# Patient Record
Sex: Female | Born: 1975 | Hispanic: Yes | Marital: Single | Smoking: Never smoker
Health system: Southern US, Community
[De-identification: ages and names within clinical notes are randomized; demographics above are authoritative.]

## PROBLEM LIST (undated history)

## (undated) DIAGNOSIS — M549 Dorsalgia, unspecified: Secondary | ICD-10-CM

## (undated) HISTORY — DX: Dorsalgia, unspecified: M54.9

## (undated) HISTORY — PX: APPENDECTOMY: SHX54

---

## 2003-11-10 ENCOUNTER — Ambulatory Visit: Payer: Self-pay | Admitting: Surgery

## 2011-05-29 ENCOUNTER — Encounter: Payer: Self-pay | Admitting: Maternal & Fetal Medicine

## 2011-05-29 LAB — CBC WITH DIFFERENTIAL/PLATELET
Basophil #: 0 10*3/uL (ref 0.0–0.1)
Basophil %: 0.4 %
Eosinophil %: 1.6 %
HCT: 34.8 % — ABNORMAL LOW (ref 35.0–47.0)
HGB: 11.1 g/dL — ABNORMAL LOW (ref 12.0–16.0)
Lymphocyte #: 2.4 10*3/uL (ref 1.0–3.6)
MCH: 24.6 pg — ABNORMAL LOW (ref 26.0–34.0)
MCV: 77 fL — ABNORMAL LOW (ref 80–100)
Monocyte %: 5.7 %
Platelet: 236 10*3/uL (ref 150–440)
RDW: 20.1 % — ABNORMAL HIGH (ref 11.5–14.5)
WBC: 7.4 10*3/uL (ref 3.6–11.0)

## 2011-05-29 LAB — COMPREHENSIVE METABOLIC PANEL
Albumin: 3.7 g/dL (ref 3.4–5.0)
Alkaline Phosphatase: 71 U/L (ref 50–136)
BUN: 7 mg/dL (ref 7–18)
Calcium, Total: 8.6 mg/dL (ref 8.5–10.1)
Chloride: 106 mmol/L (ref 98–107)
Co2: 24 mmol/L (ref 21–32)
EGFR (African American): >60
EGFR (Non-African Amer.): >60
Glucose: 89 mg/dL (ref 65–99)
Osmolality: 271 (ref 275–301)
SGPT (ALT): 21 U/L
Sodium: 137 mmol/L (ref 136–145)
Total Protein: 7.8 g/dL (ref 6.4–8.2)

## 2011-06-08 ENCOUNTER — Emergency Department: Payer: Self-pay | Admitting: Emergency Medicine

## 2011-06-08 LAB — CBC
HCT: 31.1 % — ABNORMAL LOW (ref 35.0–47.0)
HGB: 11.3 g/dL — ABNORMAL LOW (ref 12.0–16.0)
HGB: 10 g/dL — ABNORMAL LOW (ref 12.0–16.0)
MCH: 25.2 pg — ABNORMAL LOW (ref 26.0–34.0)
MCH: 24.8 pg — ABNORMAL LOW (ref 26.0–34.0)
MCHC: 32.4 g/dL (ref 32.0–36.0)
MCHC: 32.2 g/dL (ref 32.0–36.0)
MCV: 78 fL — ABNORMAL LOW (ref 80–100)
MCV: 77 fL — ABNORMAL LOW (ref 80–100)
Platelet: 239 10*3/uL (ref 150–440)
Platelet: 213 10*3/uL (ref 150–440)
RBC: 4.49 10*6/uL (ref 3.80–5.20)
RBC: 4.03 10*6/uL (ref 3.80–5.20)

## 2014-05-17 NOTE — Consult Note (Signed)
PATIENT NAME:  Sydney Shelton, Sydney Shelton MR#:  [id number] DATE OF BIRTH:  Mar 08, 1975  DATE OF CONSULTATION:  06/08/2011  REFERRING PHYSICIAN:  Dr. Carollee MassedKaminski  CONSULTING PHYSICIAN:  Ricky L. Logan BoresEvans, MD  REASON FOR CONSULT: Bleeding early pregnancy.   HISTORY OF PRESENT ILLNESS: She is a 39 year old 353, P2-0-0-2 Hispanic female who is approximately nine weeks gestation by an of LMP 03/08. Began having spotting five days ago, which progressed to heavier with some cramping last evening, steadily increased throughout the day and became severe this afternoon.   On evaluation, she was found to be hemodynamically stable. Had a beta-hCG in excess of 17,000. Ultrasound was ordered which showed no intrauterine gestation, what appeared to be a symmetrical stripe approximately 9 mm and unremarkable adnexa bilaterally. No evidence of pelvic fluid. Patient has continued to bleed briskly and therefore I was consulted.   PAST MEDICAL HISTORY: She had a cesarean section with one of children, vaginal delivery with the other. She is healthy. Does not take medicine regularly. Planned [facility] but as of yet has not had her new OB check.   REVIEW OF SYSTEMS: She feels well. By the time I saw her stated her bleeding was a lot less with the last trip to the bathroom and she was cramping less.    PHYSICAL EXAMINATION:  VITAL SIGNS: She is afebrile. Vitals stable.   GENERAL: Alert and oriented and appropriate. Good historian. With the aid of Margarete as interpreter, reviewed with her the situation to which most likely had a completed miscarriage. I wanted to do an exam to make sure uterus was firming, her cervix appeared to be closing and she consented.    PELVIC: Pelvic tray was obtained. I went in with the Emergency Room nurse and speculum exam was carried out. There was what appeared to be decidual tissue at the os which teased free easily. The cervix would not admit the head of the ring  forceps and there was no active bleeding noted. Speculum was removed. Bimanual exam showed what appeared to be a firming uterus (patient is obese which obscured exam). Again the cervix appeared to be open.    IMPRESSION: Completed abortion.   PLAN: I am going to give her 0.2 of Methergine IV. She is to discharge home with no sex for two weeks, to see me again in two weeks and expect a menstrual-type flow for 5 to 10 days but if she becomes concerned she is to return to the ER. Anticipate no sequelae from this.   ____________________________ Reatha Harpsicky L. Logan BoresEvans, MD rle:cms D: 06/08/2011 21:49:13 ET T: 06/09/2011 09:34:15 ET JOB#: [id number]  cc: Ricky L. Logan BoresEvans, MD, <Dictator> Augustina MoodICK L Twan Harkin MD ELECTRONICALLY SIGNED 06/12/2011 12:13

## 2014-05-17 NOTE — Consult Note (Signed)
Referral Information:   Reason for Referral 39 yo G3P1101 with LMP 03/30/11 and EDD 01/04/12 8w 4 days by LMP referred for consultation due to hiv discordance (husband with virus x 12 years, followed at [facility] and history of nondetectable viral load),  vaccine exposure (mmr, influenza), and previous preterm birth/spontaneous preterm labor, children with congenital hearing deficits, and advanced maternal age.    Referring Physician [facility]    Prenatal Hx as above. couple reports consistent condom usage, however, report condom broke resulting in current pregnancy    Past Obstetrical Hx 2000, term, female vaginal delivery, female infant, 6lb 2003, preterm labor 'one week early', female breech, cesarean delivery, 5lb 12 oz   Home Medications: Medication Instructions Status  Prenatal Multivitamins oral tablet 1 tab(s) orally once a day Active   Allergies:   NKA: None  Vital Signs/Notes:  Nursing Vital Signs: **Vital Signs.:   06-May-13 09:06   Vital Signs Type Routine   Temperature Temperature (F) 98.1   Celsius 36.7   Temperature Source oral   Pulse Pulse 67   Pulse source per Dinamap   Respirations Respirations 12   Systolic BP Systolic BP 030   Diastolic BP (mmHg) Diastolic BP (mmHg) 60   Mean BP 74   BP Source Dinamap   Perinatal Consult:   Past Medical History cont'd 1. 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  Couple had many questions re transmission of virus to fetus and her risks.  Spanish  interpreter assistance used.  We addressed zero transmission in setting of maternal hiv negative status. We addressed the low risk of transmission in the setting of partners negative viral load and use of condomes. We also addressed post exposure prophylaxis options to decrease her risk in the event of another exposure (condom breakage, unprotected intercourse).  We also addressed CDC recommendations for preexposure prophylaxis with emtrictiabine/tenofovir (truvada) in the setting of hiv discordant couples and in the setting of couples attempting conception.  We adressed the fact that this regimen is not associated with teratogenicity, however, its use is not yet recommended in pregnancy. The CDC recommends practitioners report outcomes of patients remaining on this treatement following pregnancy, but does not yet recommend its use for hiv discordant couples during pregnancy. We also addressed general risks for transmission of the virus to a neonate in the setting of maternal hiv infection (NOT the scenario here). In the setting of HAART therapy (highly active antiretroviral rx) and nondetectable viral load, the risk is less than 1%. This risk can be further reduced by offering c/s at 38 weeks, prior to the onset of labor.  2. We addressed her slightly increased risk for recurrent preterm birth given her preterm labor at [place].  3.  She met with our genetic counselor due to her daugters with history of congenital hearing loss, as well as her advanced maternal age. She declines nuchal translucency(NT) mesurement or serum screening for aneuploidy.  Consent was obtained to review the children's records from [facility].    Recommendations 1. Recommend hiv testing each trimester.  She had negative testing in  the first trimester.  We would recommend follow up screening in the second trimester (can perform with her glucola screen) and again in the third trimester (at ~35-[redacted] weeks gestation).  Try to obtain PCR testing for  the third trimester since this will detect the viral load and determine if there is early infection. If this testing is positive, I would treat according to intrapartum guidelines (with AZT) and plan for repeat c/s at 38 weeks.  If this testing remains negative, routine care should continue. 2.  I provided her with a prescription for Combivir (AZT/lamivudine) to take in the event they experience another condom failure. We addressed the need to begin Rx immediately and no later than 72 h post exposure.  We also addressed potential side effects of meds (anemia, n/v, liver damage) and the need to notify health care provider should she need to take this med.  This combination is used frequently during pregnancy and is not associated with teratogenicity. 3. I obtained baseline maternal chemistry panel, cbc, liver function testing. If she needs to take postexposure prophylaxis, I would consider initiation of preexposure prophylaxis for the remainder of pregnancy (truvada).   4. Recommend baseline cervical length at the time of the anatomic survey due to history of later prreterm birth.  We addressed possible options (vaginal progesterone, decreased activity level) if cervical shortening is seen at that time. 5. Vaccines administered are not associated with teratogenicity and the patient was informed of this fact. 6. She declines further serum screening or NT for aneuploidy detection 7.  She met withgenetic counselor and gave consent for acquisition of daughters' [facility] records  I spoke with my colleague, Dr. Lehman Prom about these recommendations as this is a particular area of expertise and interest for her.    She will see the patient for follow up at the time of her next ultrasound.   Plan:   Genetic Counseling yes, performed today    Prenatal Diagnosis Options declines aneuploidy testing, see genetic counselor noted for details    Additional Testing Folate/prenatal vitamins     Total Time Spent with  Patient 45 minutes    >50% of visit spent in couseling/coordination of care yes    Office Use Only 99243  Level 3 (36mn) NEW office consult detailed   Coding Description: MATERNAL CONDITIONS/HISTORY INDICATION(S).   History of prior preg w/ preterm labor or complicated by PTL.   Previous C-section.   OTHER: family history of birth defects.  Electronic Signatures: SManfred Shirts(MD)  (Signed 06-May-13 12:57)  Authored: Referral, Home Medications, Allergies, Vital Signs/Notes, Consult, Impression, Plan, Billing, Coding Description   Last Updated: 06-May-13 12:57 by SManfred Shirts(MD)

## 2015-11-17 DIAGNOSIS — Z315 Encounter for genetic counseling: Secondary | ICD-10-CM | POA: Insufficient documentation

## 2015-11-17 DIAGNOSIS — O09521 Supervision of elderly multigravida, first trimester: Secondary | ICD-10-CM | POA: Insufficient documentation

## 2015-12-07 DIAGNOSIS — O352XX0 Maternal care for (suspected) hereditary disease in fetus, not applicable or unspecified: Secondary | ICD-10-CM | POA: Insufficient documentation

## 2016-11-22 ENCOUNTER — Other Ambulatory Visit: Payer: Self-pay | Admitting: Internal Medicine

## 2016-11-22 DIAGNOSIS — R7303 Prediabetes: Secondary | ICD-10-CM | POA: Insufficient documentation

## 2016-11-22 DIAGNOSIS — E7800 Pure hypercholesterolemia, unspecified: Secondary | ICD-10-CM | POA: Insufficient documentation

## 2016-11-22 DIAGNOSIS — Z1239 Encounter for other screening for malignant neoplasm of breast: Secondary | ICD-10-CM

## 2018-03-19 ENCOUNTER — Other Ambulatory Visit: Payer: Self-pay | Admitting: Internal Medicine

## 2018-03-19 DIAGNOSIS — Z1231 Encounter for screening mammogram for malignant neoplasm of breast: Secondary | ICD-10-CM

## 2018-03-26 ENCOUNTER — Ambulatory Visit
Admission: RE | Admit: 2018-03-26 | Discharge: 2018-03-26 | Disposition: A | Discharge status: Home / Self Care | Payer: Self-pay | Source: Ambulatory Visit | Attending: Internal Medicine | Admitting: Internal Medicine

## 2018-03-26 DIAGNOSIS — Z1231 Encounter for screening mammogram for malignant neoplasm of breast: Secondary | ICD-10-CM | POA: Insufficient documentation

## 2019-04-13 ENCOUNTER — Ambulatory Visit: Payer: Self-pay | Attending: Internal Medicine

## 2019-04-13 ENCOUNTER — Other Ambulatory Visit: Payer: Self-pay

## 2019-04-13 DIAGNOSIS — Z23 Encounter for immunization: Secondary | ICD-10-CM

## 2019-04-13 NOTE — Progress Notes (Signed)
   Covid-19 Vaccination Clinic  Name:  Sydney Shelton    MRN: [id number] DOB: Aug 21, 1975  04/13/2019  Ms. Sydney Shelton was observed post Covid-19 immunization for 15 minutes without incident. She was provided with Vaccine Information Sheet and instruction to access the V-Safe system.   Ms. Sydney Shelton was instructed to call 911 with any severe reactions post vaccine: Marland Kitchen Difficulty breathing  . Swelling of face and throat  . A fast heartbeat  . A bad rash all over body  . Dizziness and weakness   Immunizations Administered    Name Date Dose VIS Date Route   Pfizer COVID-19 Vaccine 04/13/2019  5:29 PM 0.3 mL 01/03/2019 Intramuscular   Manufacturer: [employer], [employer]   Lot: [id number]   NDC: [address]-2

## 2019-04-24 ENCOUNTER — Ambulatory Visit: Payer: Self-pay

## 2019-05-04 ENCOUNTER — Ambulatory Visit: Payer: Self-pay | Attending: Internal Medicine

## 2019-05-04 DIAGNOSIS — Z23 Encounter for immunization: Secondary | ICD-10-CM

## 2019-05-04 NOTE — Progress Notes (Signed)
   Covid-19 Vaccination Clinic  Name:  Sydney Shelton    MRN: [id number] DOB: 1975/10/13  05/04/2019  Ms. Sydney Shelton was observed post Covid-19 immunization for 15 minutes without incident. She was provided with Vaccine Information Sheet and instruction to access the V-Safe system.   Ms. Sydney Shelton was instructed to call 911 with any severe reactions post vaccine: Marland Kitchen Difficulty breathing  . Swelling of face and throat  . A fast heartbeat  . A bad rash all over body  . Dizziness and weakness   Immunizations Administered    Name Date Dose VIS Date Route   Pfizer COVID-19 Vaccine 05/04/2019  4:21 PM 0.3 mL 01/03/2019 Intramuscular   Manufacturer: [employer], [employer]   Lot: [phone] [id number]   NDC: [address]-6

## 2019-10-16 ENCOUNTER — Other Ambulatory Visit: Payer: Self-pay | Admitting: Physician Assistant

## 2019-10-16 DIAGNOSIS — N644 Mastodynia: Secondary | ICD-10-CM

## 2020-01-14 ENCOUNTER — Other Ambulatory Visit: Payer: Self-pay | Admitting: Physician Assistant

## 2020-01-14 DIAGNOSIS — R1011 Right upper quadrant pain: Secondary | ICD-10-CM

## 2020-01-19 ENCOUNTER — Ambulatory Visit
Admission: RE | Admit: 2020-01-19 | Discharge: 2020-01-19 | Disposition: A | Discharge status: Home / Self Care | Payer: BLUE CROSS/BLUE SHIELD | Source: Ambulatory Visit | Attending: Physician Assistant | Admitting: Physician Assistant

## 2020-01-19 ENCOUNTER — Other Ambulatory Visit: Payer: Self-pay

## 2020-01-19 DIAGNOSIS — R1011 Right upper quadrant pain: Secondary | ICD-10-CM | POA: Insufficient documentation

## 2020-01-29 ENCOUNTER — Ambulatory Visit (LOCAL_COMMUNITY_HEALTH_CENTER): Payer: Self-pay

## 2020-01-29 ENCOUNTER — Other Ambulatory Visit: Payer: Self-pay

## 2020-01-29 VITALS — BP 123/76 | Ht 62.0 in | Wt 196.5 lb

## 2020-01-29 DIAGNOSIS — Z3202 Encounter for pregnancy test, result negative: Secondary | ICD-10-CM

## 2020-01-29 LAB — PREGNANCY, URINE: Preg Test, Ur: NEGATIVE

## 2020-01-29 NOTE — Progress Notes (Signed)
UPT negative today. LMP 12/28/2019. Per pt, last sex one week ago with condom.  Reports feeling movement in abdomen, "feels hard. " Reports being seen by [facility] and had ultrasound. States no pregnancy has been diagnosed.  RN counseled pt to follow up with PCP and to seek immediate medical attn with increased abd pain and/or bleeding. Counseled to return for preg test if no menses. Per pt request,  local provider resource list given and explained. Questions answered and reports understanding. Juliene Pina, interpeter today. Jerel Shepherd, RN

## 2020-03-08 ENCOUNTER — Other Ambulatory Visit: Payer: Self-pay | Admitting: Internal Medicine

## 2020-03-08 DIAGNOSIS — R140 Abdominal distension (gaseous): Secondary | ICD-10-CM

## 2020-04-29 ENCOUNTER — Other Ambulatory Visit: Payer: Self-pay | Admitting: Internal Medicine

## 2020-04-29 DIAGNOSIS — R1084 Generalized abdominal pain: Secondary | ICD-10-CM

## 2020-05-27 ENCOUNTER — Other Ambulatory Visit: Payer: Self-pay

## 2020-05-27 ENCOUNTER — Ambulatory Visit
Admission: RE | Admit: 2020-05-27 | Discharge: 2020-05-27 | Disposition: A | Discharge status: Home / Self Care | Payer: BC Managed Care – PPO | Source: Ambulatory Visit | Attending: Internal Medicine | Admitting: Internal Medicine

## 2020-05-27 DIAGNOSIS — R1084 Generalized abdominal pain: Secondary | ICD-10-CM | POA: Insufficient documentation

## 2020-05-27 MED ORDER — IOHEXOL 300 MG/ML  SOLN
100.0000 mL | Freq: Once | INTRAMUSCULAR | Status: AC | PRN
  Administered 2020-05-27: 100 mL via INTRAVENOUS

## 2020-09-10 ENCOUNTER — Ambulatory Visit: Payer: Self-pay | Admitting: Podiatry

## 2020-11-10 ENCOUNTER — Encounter: Payer: Self-pay | Admitting: Podiatry

## 2020-11-10 ENCOUNTER — Encounter (INDEPENDENT_AMBULATORY_CARE_PROVIDER_SITE_OTHER): Payer: Self-pay

## 2020-11-10 ENCOUNTER — Other Ambulatory Visit: Payer: Self-pay

## 2020-11-10 ENCOUNTER — Ambulatory Visit (INDEPENDENT_AMBULATORY_CARE_PROVIDER_SITE_OTHER): Payer: 59 | Admitting: Podiatry

## 2020-11-10 DIAGNOSIS — N920 Excessive and frequent menstruation with regular cycle: Secondary | ICD-10-CM | POA: Insufficient documentation

## 2020-11-10 DIAGNOSIS — B351 Tinea unguium: Secondary | ICD-10-CM

## 2020-11-10 MED ORDER — TERBINAFINE HCL 250 MG PO TABS: 250.0000 mg | ORAL_TABLET | Freq: Every day | ORAL | 0 refills | Status: AC

## 2020-11-10 NOTE — Progress Notes (Signed)
  Subjective:  Patient ID: Sydney Shelton, female    DOB: 1975/08/19,  MRN: [id number]  Chief Complaint  Patient presents with   Nail Problem    New pt-Nail fungus on both feet-pts [facility] would not process. Pt aware will be SELF PAY if insurance doesnt go through.    45 y.o. female presents with the above complaint. History confirmed with patient.  Translator is available by phone to provide translation from [place] to [place].  The nails have gotten thickened and brown discolored on the right foot  Objective:  Physical Exam: warm, good capillary refill, no trophic changes or ulcerative lesions, normal DP and PT pulses, and normal sensory exam. Left Foot: normal exam, no swelling, tenderness, instability; ligaments intact, full range of motion of all ankle/foot joints Right Foot: dystrophic yellowed discolored nail plates with subungual debris   Assessment:   1. Onychomycosis      Plan:  Patient was evaluated and treated and all questions answered.  Discussed etiology and treatment options of onychomycosis in detail.  We discussed oral and topical therapy.  I recommend oral therapy with Lamisil.  She has no history of liver disease and her last LFTs were normal.  Discussed the risk benefits and potential side effects of the medication.  I will see her back in 4 months after treatment.  Return in about 4 months (around 03/13/2021) for follow up after nail fungus treatment.

## 2021-03-14 ENCOUNTER — Ambulatory Visit: Payer: Self-pay | Admitting: Podiatry

## 2021-04-11 ENCOUNTER — Ambulatory Visit: Payer: Self-pay | Admitting: Podiatry

## 2021-05-18 ENCOUNTER — Ambulatory Visit: Payer: Self-pay | Admitting: Podiatry

## 2021-05-19 ENCOUNTER — Other Ambulatory Visit: Payer: Self-pay | Admitting: Internal Medicine

## 2021-05-19 DIAGNOSIS — Z1231 Encounter for screening mammogram for malignant neoplasm of breast: Secondary | ICD-10-CM

## 2021-05-25 ENCOUNTER — Other Ambulatory Visit: Payer: Self-pay | Admitting: Internal Medicine

## 2021-05-25 DIAGNOSIS — N644 Mastodynia: Secondary | ICD-10-CM

## 2021-05-25 DIAGNOSIS — Z1231 Encounter for screening mammogram for malignant neoplasm of breast: Secondary | ICD-10-CM

## 2021-07-18 ENCOUNTER — Ambulatory Visit: Payer: Self-pay | Admitting: Podiatry

## 2021-08-01 ENCOUNTER — Ambulatory Visit (INDEPENDENT_AMBULATORY_CARE_PROVIDER_SITE_OTHER): Payer: BC Managed Care – PPO | Admitting: Podiatry

## 2021-08-01 ENCOUNTER — Encounter: Payer: Self-pay | Admitting: Podiatry

## 2021-08-01 DIAGNOSIS — B351 Tinea unguium: Secondary | ICD-10-CM

## 2021-08-01 MED ORDER — TERBINAFINE HCL 250 MG PO TABS: 250.0000 mg | ORAL_TABLET | Freq: Every day | ORAL | 0 refills | Status: AC

## 2021-08-01 NOTE — Progress Notes (Signed)
  Subjective:  Patient ID: Sydney Shelton, female    DOB: 28-Oct-1975,  MRN: [id number]  Follow-up on nail fungus  46 y.o. female presents with the above complaint. History confirmed with patient.  She has improvement about 50% better than it was, she took the 3 months of Lamisil had no side effects  Objective:  Physical Exam: warm, good capillary refill, no trophic changes or ulcerative lesions, normal DP and PT pulses, and normal sensory exam.  Right hallux with 50% clearance of onychomycosis    Assessment:   1. Onychomycosis       Plan:  Patient was evaluated and treated and all questions answered.  Overall doing well and has some improvement she has noted about 50% improvement since beginning therapy with Lamisil.  I recommend continued therapy with p.o. Lamisil.  Rx was sent to pharmacy for a refill.  I will see her back in 4 months for follow-up  Return in about 4 months (around 12/02/2021) for follow up after nail fungus treatment.

## 2021-11-17 ENCOUNTER — Other Ambulatory Visit: Payer: BC Managed Care – PPO

## 2021-11-30 ENCOUNTER — Other Ambulatory Visit: Payer: Self-pay | Admitting: Internal Medicine

## 2021-11-30 ENCOUNTER — Encounter: Payer: Self-pay | Admitting: Internal Medicine

## 2021-11-30 ENCOUNTER — Ambulatory Visit
Admission: RE | Admit: 2021-11-30 | Discharge: 2021-11-30 | Disposition: A | Discharge status: Home / Self Care | Payer: BC Managed Care – PPO | Source: Ambulatory Visit | Attending: Internal Medicine | Admitting: Internal Medicine

## 2021-11-30 DIAGNOSIS — Z1231 Encounter for screening mammogram for malignant neoplasm of breast: Secondary | ICD-10-CM | POA: Diagnosis present

## 2021-11-30 DIAGNOSIS — N644 Mastodynia: Secondary | ICD-10-CM | POA: Insufficient documentation

## 2021-12-05 ENCOUNTER — Ambulatory Visit: Payer: BC Managed Care – PPO | Admitting: Podiatry

## 2022-01-02 ENCOUNTER — Ambulatory Visit: Payer: BC Managed Care – PPO | Admitting: Podiatry

## 2022-01-30 ENCOUNTER — Ambulatory Visit: Payer: BC Managed Care – PPO | Admitting: Podiatry

## 2022-02-27 ENCOUNTER — Ambulatory Visit: Payer: BC Managed Care – PPO | Admitting: Podiatry

## 2022-02-27 ENCOUNTER — Encounter: Payer: Self-pay | Admitting: Podiatry

## 2022-02-27 VITALS — BP 130/81 | HR 74

## 2022-02-27 DIAGNOSIS — B351 Tinea unguium: Secondary | ICD-10-CM

## 2022-02-27 MED ORDER — TERBINAFINE HCL 250 MG PO TABS: 250.0000 mg | ORAL_TABLET | Freq: Every day | ORAL | 0 refills | Status: AC

## 2022-02-27 NOTE — Progress Notes (Signed)
  Subjective:  Patient ID: Sydney Shelton, female    DOB: June 25, 1975,  MRN: [id number]  Follow-up on nail fungus  47 y.o. female presents with the above complaint. History confirmed with patient.  She said the right great toenail is doing much better, the left has started to show discoloration over the last couple of months  Objective:  Physical Exam: warm, good capillary refill, no trophic changes or ulcerative lesions, normal DP and PT pulses, and normal sensory exam.  Right hallux with 80 % clearance of onychomycosis, left great toe onychomycosis with 20% involvement    Assessment:   1. Onychomycosis       Plan:  Patient was evaluated and treated and all questions answered.  She has done very well with previous Lamisil administration.  I recommend we continue this another 90-day course.  This was sent to her pharmacy.  She will return in 3 months for follow-up, I think her last course needed further close follow-up and readministration of the Lamisil and she missed her appointment with me in November so suspect this is likely residual onychomycosis as opposed to new infection  Return in about 3 months (around 05/28/2022) for follow up after nail fungus treatment.

## 2022-05-31 ENCOUNTER — Ambulatory Visit: Payer: BC Managed Care – PPO | Admitting: Podiatry

## 2022-08-12 IMAGING — US US ABDOMEN LIMITED
1 series · 14 of 25 positions shown · non-contrast
Comparison: None.

CLINICAL DATA: Right upper quadrant pain for 2 weeks.

EXAM:
ULTRASOUND ABDOMEN LIMITED RIGHT UPPER QUADRANT

[Series 1: us abdomen limited ruq (liver/gb) · 14 of 42 slices shown]
[im 1/42]
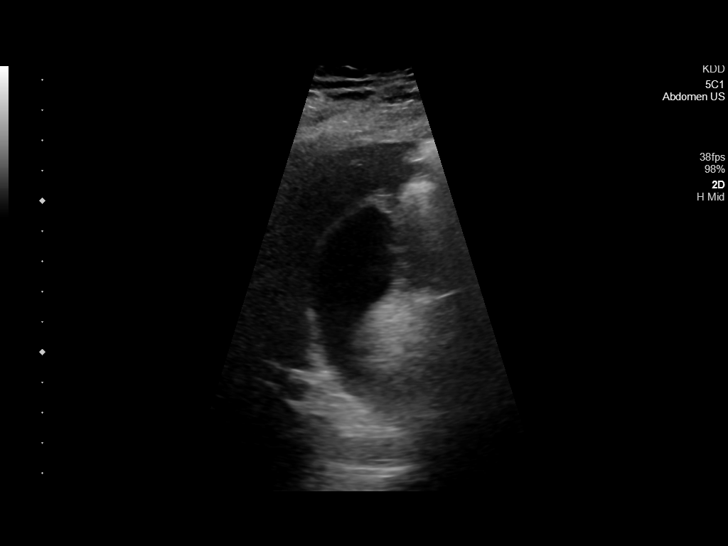
[im 4/42]
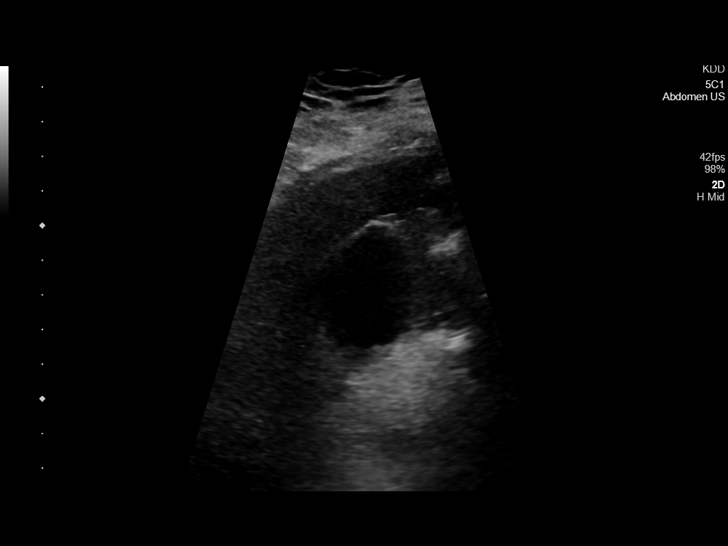
[im 7/42]
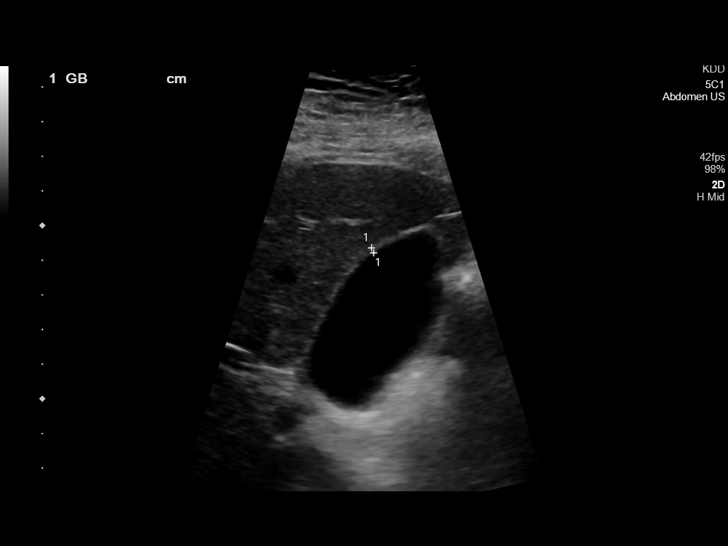
[im 11/42]
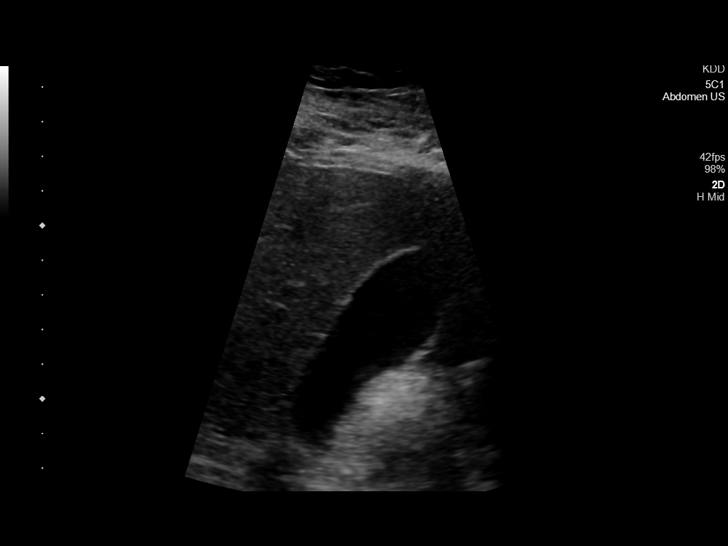
[im 14/42]
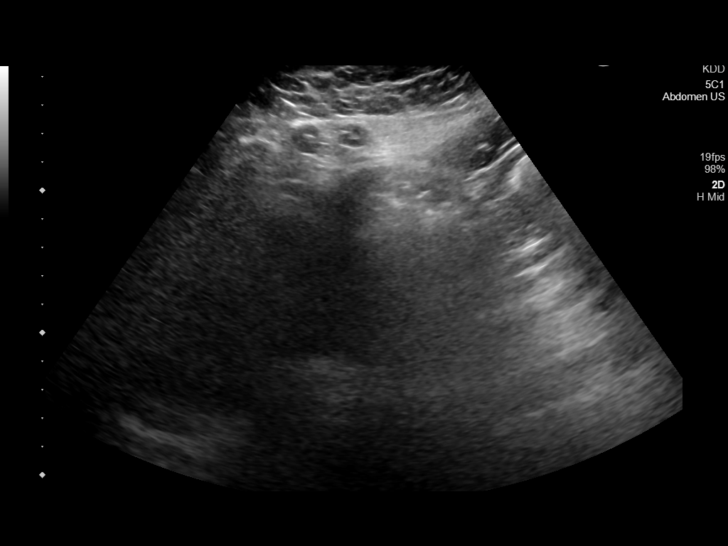
[im 16/42]
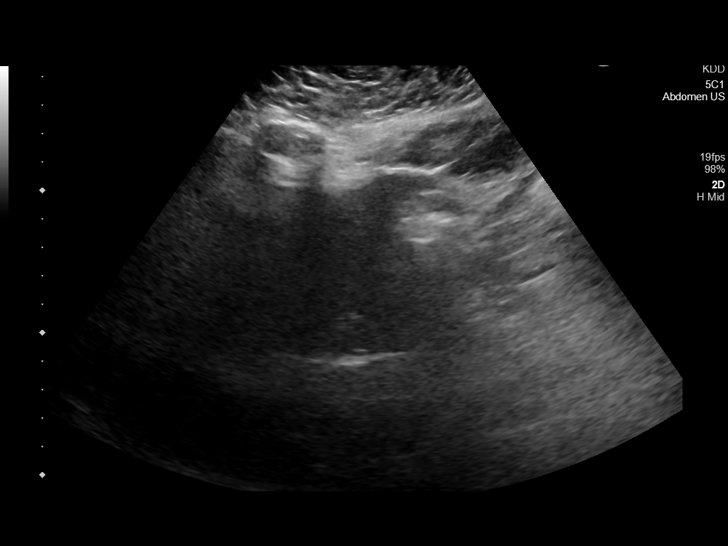
[im 19/42]
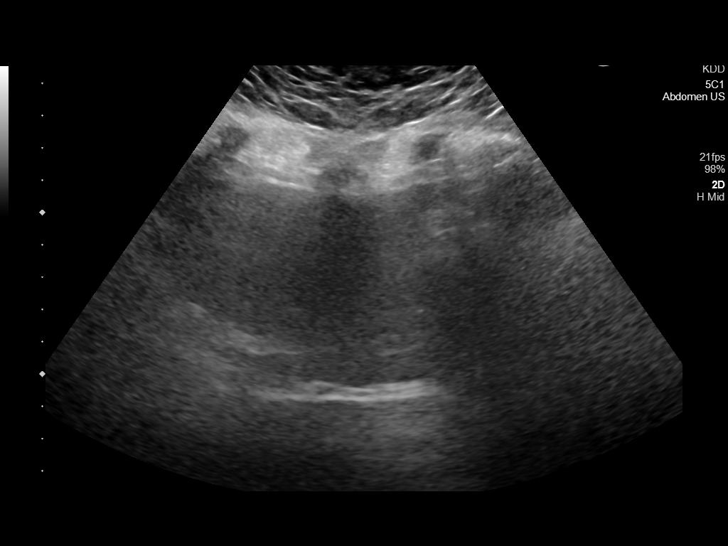
[im 23/42]
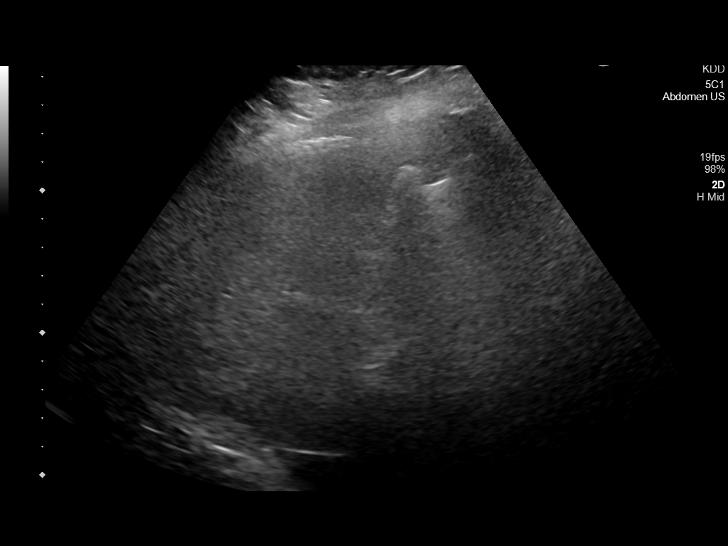
[im 26/42]
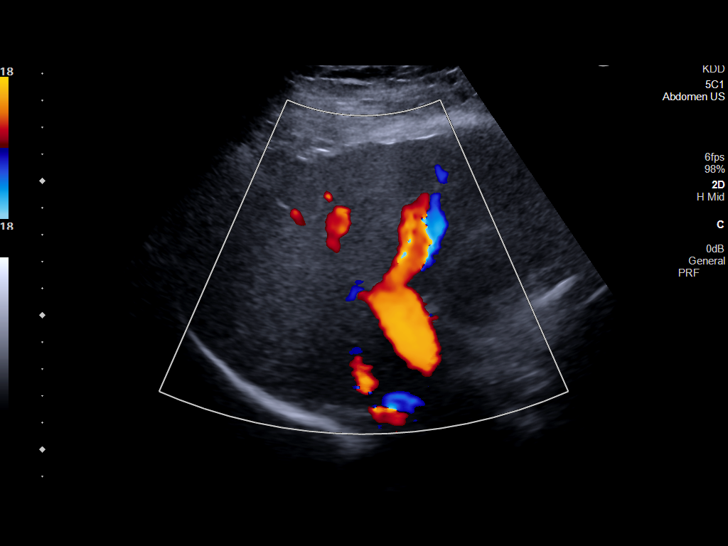
[im 28/42]
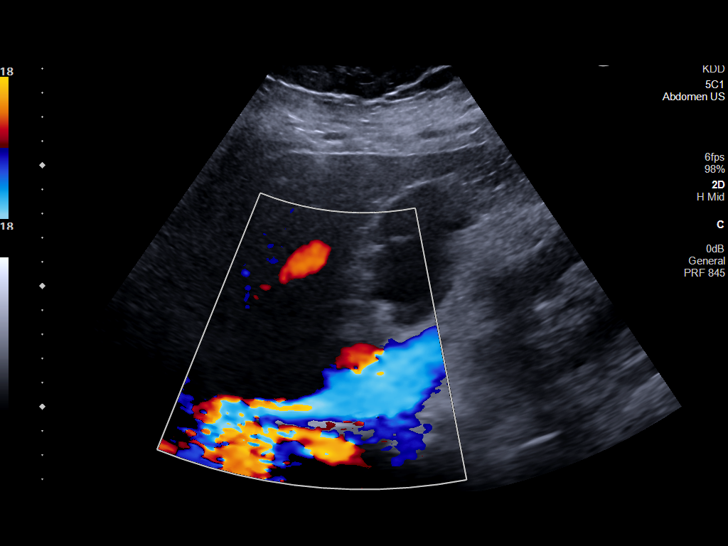
[im 31/42]
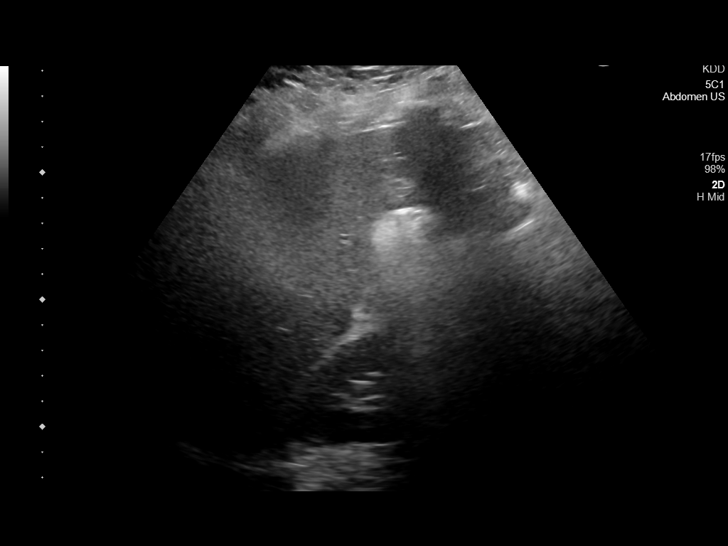
[im 35/42]
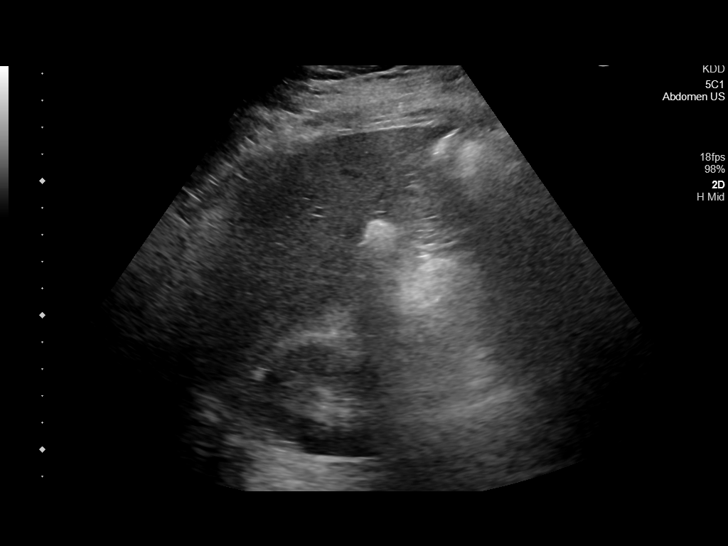
[im 38/42]
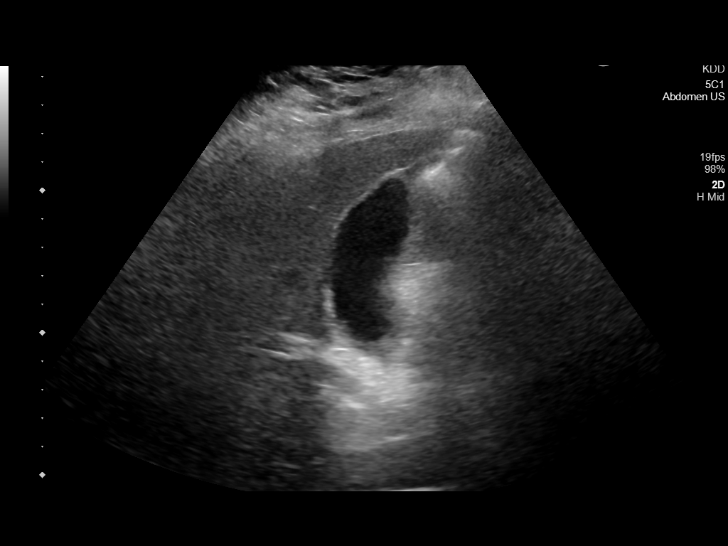
[im 42/42]
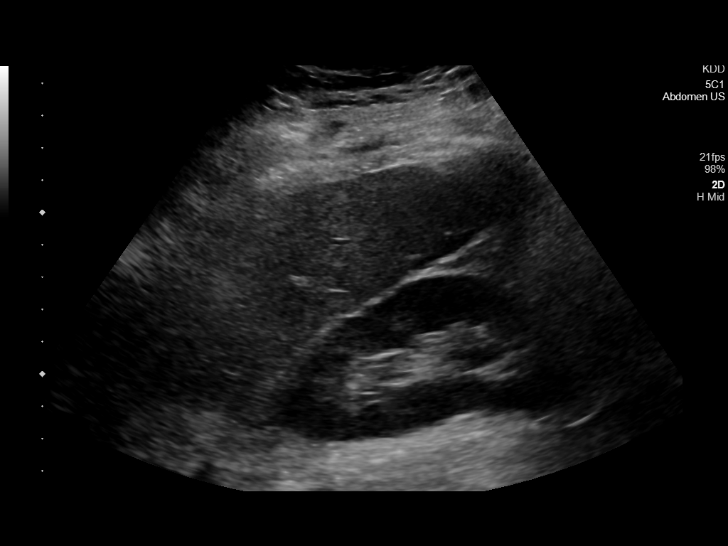

[14 of 25 positions shown; findings below may reference images not displayed]

FINDINGS: Gallbladder:

No gallstones or wall thickening visualized. No sonographic Murphy
sign noted by sonographer.

Common bile duct:

Diameter: 0.2 cm

Liver:

No focal lesion identified. Within normal limits in parenchymal
echogenicity. Portal vein is patent on color Doppler imaging with
normal direction of blood flow towards the liver.

Other: None.
IMPRESSION: Normal exam.  Negative for gallstones.

## 2022-08-28 ENCOUNTER — Ambulatory Visit: Payer: Self-pay | Admitting: Podiatry

## 2022-10-27 ENCOUNTER — Other Ambulatory Visit: Payer: Self-pay | Admitting: Obstetrics and Gynecology

## 2022-10-27 DIAGNOSIS — Z1231 Encounter for screening mammogram for malignant neoplasm of breast: Secondary | ICD-10-CM

## 2022-12-12 ENCOUNTER — Ambulatory Visit
Admission: RE | Admit: 2022-12-12 | Discharge: 2022-12-12 | Disposition: A | Discharge status: Home / Self Care | Payer: BC Managed Care – PPO | Source: Ambulatory Visit | Attending: Obstetrics and Gynecology | Admitting: Obstetrics and Gynecology

## 2022-12-12 DIAGNOSIS — Z1231 Encounter for screening mammogram for malignant neoplasm of breast: Secondary | ICD-10-CM | POA: Insufficient documentation

## 2022-12-19 IMAGING — CT CT ABD-PELV W/ CM
2 of 5 series · 15 of 46 positions shown, 17 images · IV contrast (APPLIED)
Comparison: Ultrasound January 19, 2020,June 08, 2011 and CT
November 10, 2003.

CLINICAL DATA: Abdominal distension concern for pelvic mass

EXAM:
CT ABDOMEN AND PELVIS WITH CONTRAST
TECHNIQUE: Multidetector CT imaging of the abdomen and pelvis was performed
using the standard protocol following bolus administration of
intravenous contrast.
CONTRAST:  100mL OMNIPAQUE IOHEXOL 300 MG/ML  SOLN

[Series 2: axial st · axial · 0.75mm/px · z∈[-848,-408]mm · 12 of 100 slices shown, 14 images]
[im 6/100  soft-tissue]
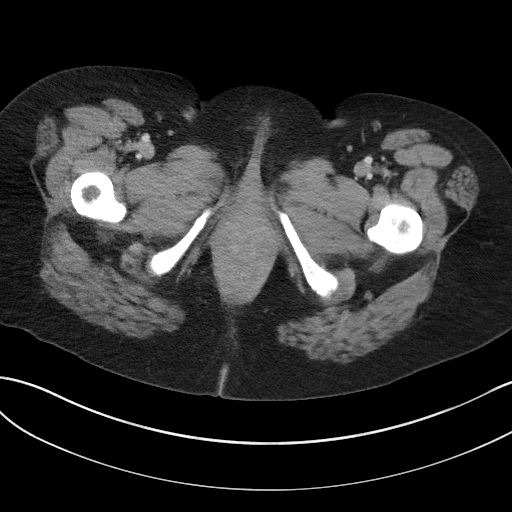
[im 6/100  bone]
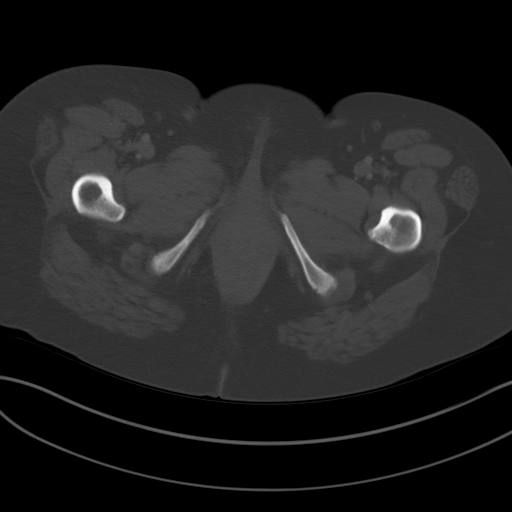
[im 17/100  soft-tissue]
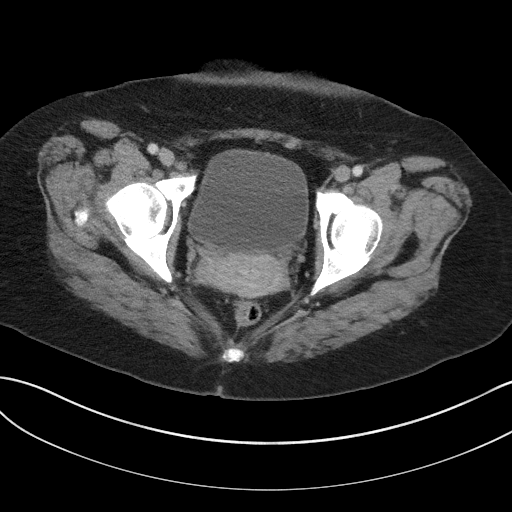
[im 23/100  soft-tissue]
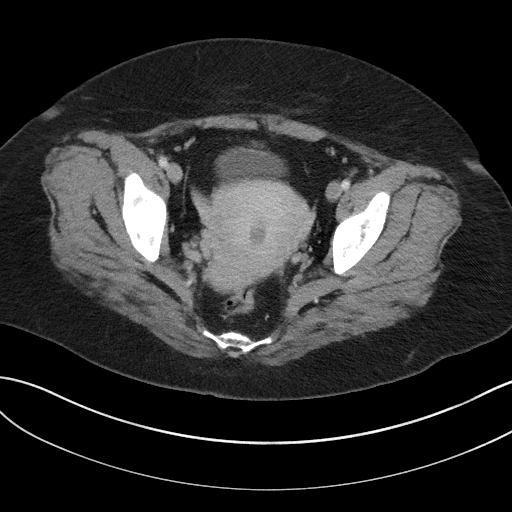
[im 28/100  soft-tissue]
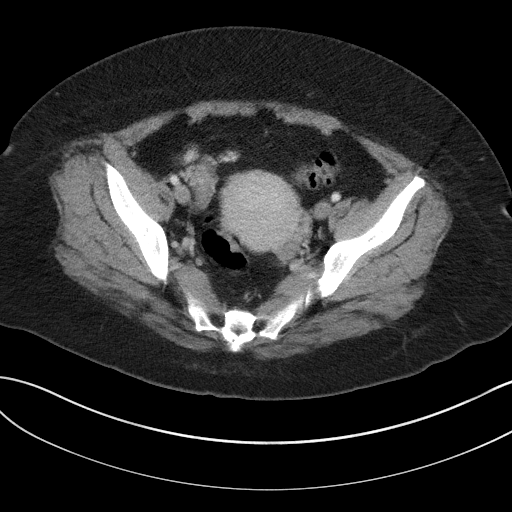
[im 39/100  soft-tissue]
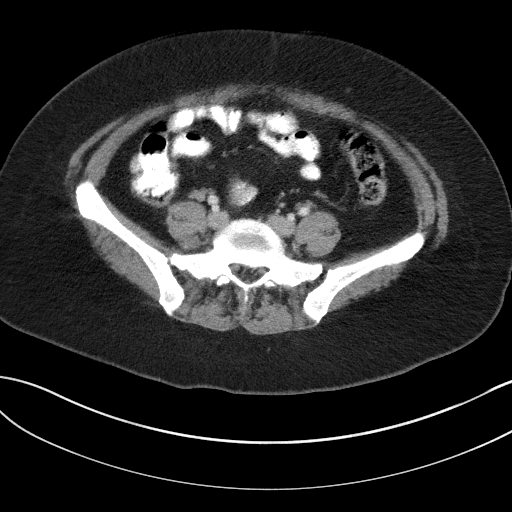
[im 45/100  soft-tissue]
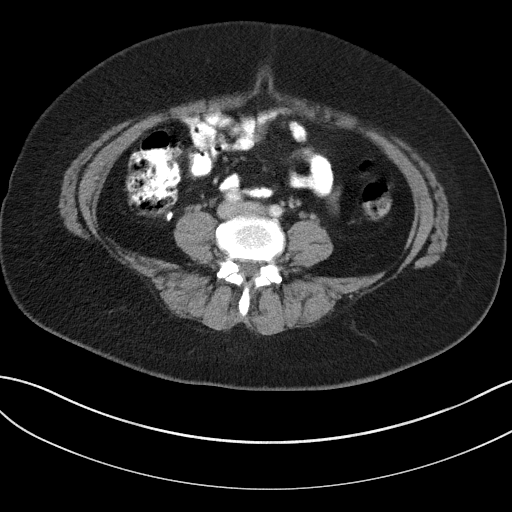
[im 56/100  soft-tissue]
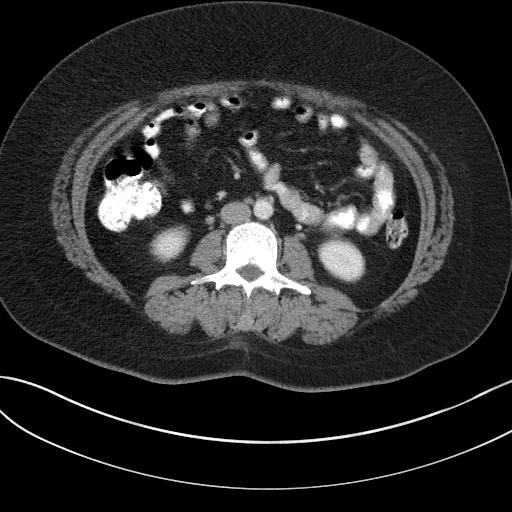
[im 61/100  soft-tissue]
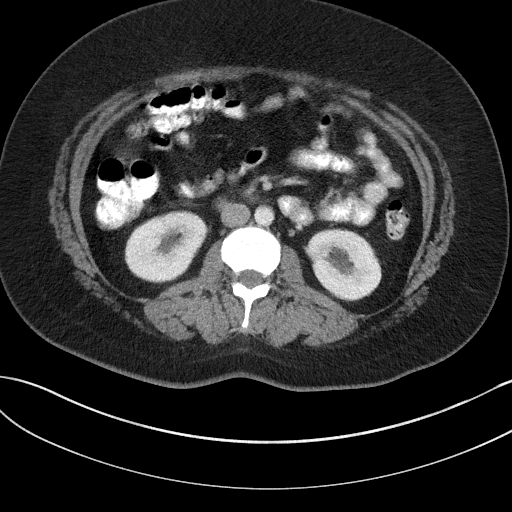
[im 72/100  soft-tissue]
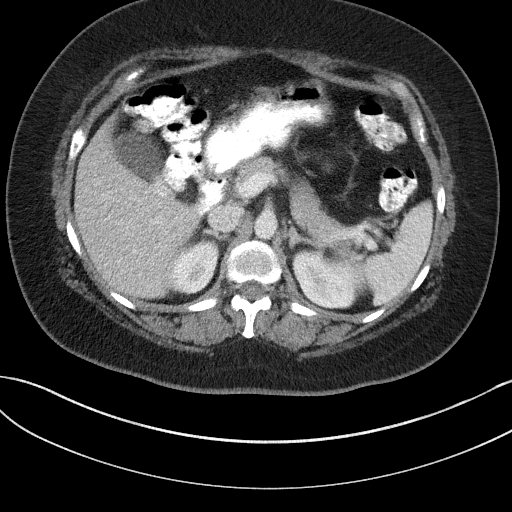
[im 72/100  bone]
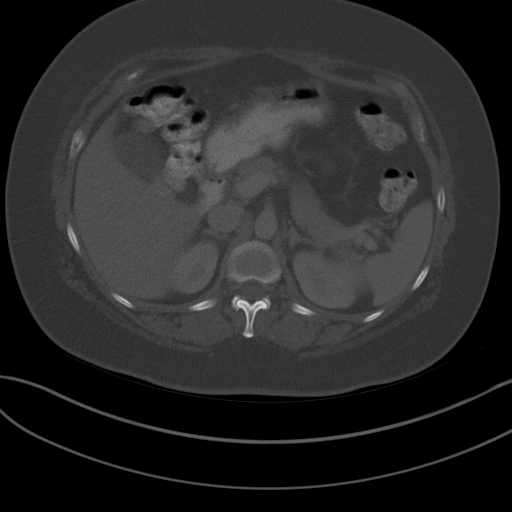
[im 78/100  soft-tissue]
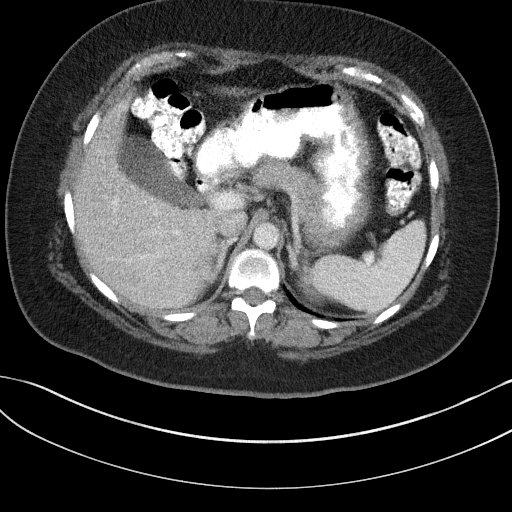
[im 83/100  soft-tissue]
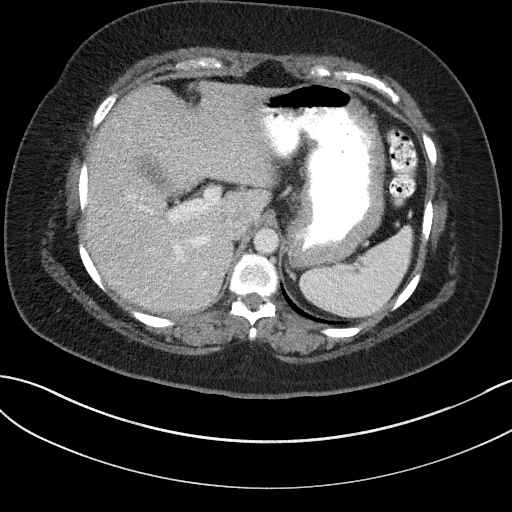
[im 94/100  soft-tissue]
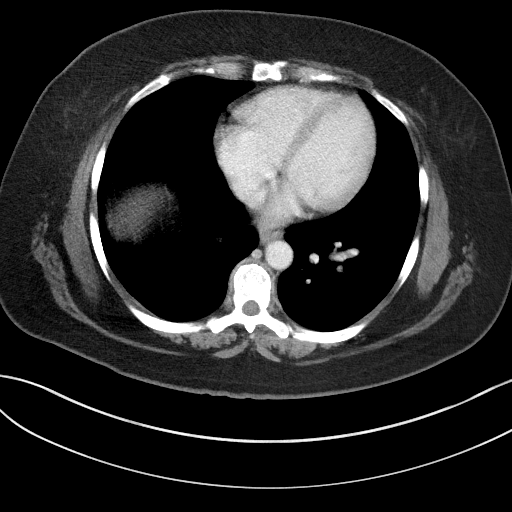

[Series 5: coronal st · coronal · 0.70mm/px · 3 of 105 slices shown]
[im 35/105  soft-tissue]
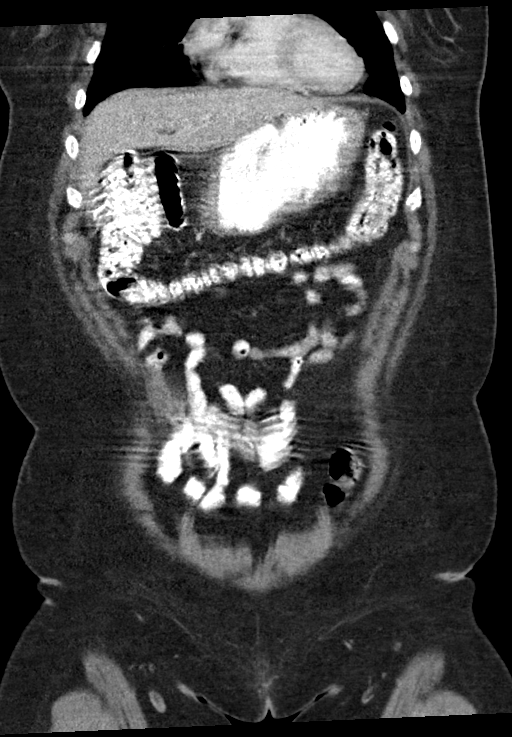
[im 47/105  soft-tissue]
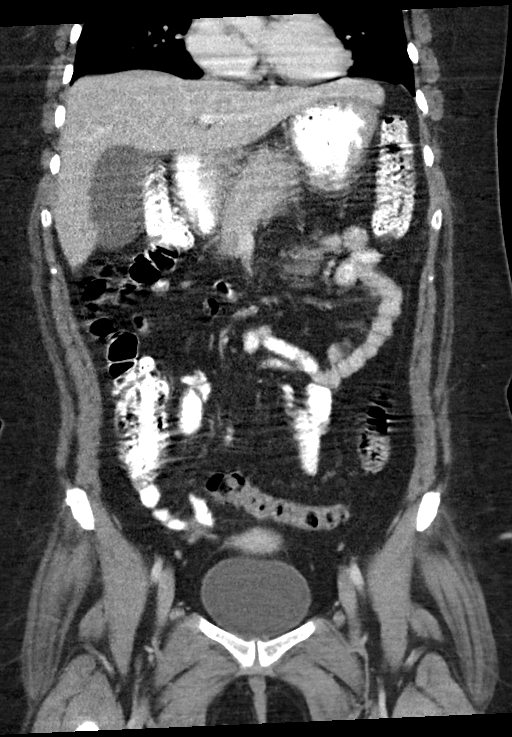
[im 58/105  soft-tissue]
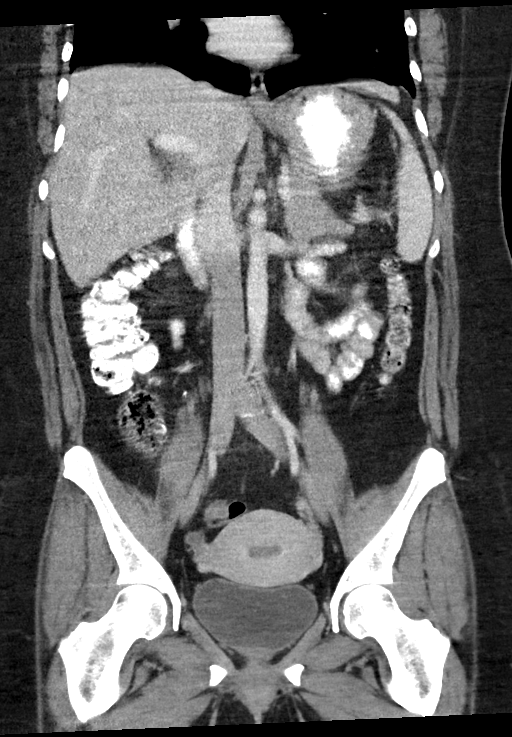

[15 of 46 positions shown; findings below may reference images not displayed]

FINDINGS: Despite efforts by the technologist and patient, motion artifact is
present on today's exam and could not be eliminated. This reduces
exam sensitivity and specificity.

Lower chest: No acute abnormality.

Hepatobiliary: No suspicious hepatic lesion. Gallbladder is
unremarkable. No biliary ductal dilation.

Pancreas: Within normal limits.

Spleen: Within normal limits.

Adrenals/Urinary Tract: Adrenal glands are unremarkable. Kidneys are
normal, without renal calculi, focal lesion, or hydronephrosis.
Bladder is unremarkable.

Stomach/Bowel: Radiopaque enteric contrast traverses the splenic
flexure. Stomach is grossly unremarkable. No suspicious small bowel
wall thickening or dilation. Sigmoid colonic diverticulosis without
findings of acute diverticulitis. Appendix is surgically absent.

Vascular/Lymphatic: No significant vascular findings are present. No
enlarged abdominal or pelvic lymph nodes.

Reproductive: Unremarkable premenopausal CT appearance of the
uterus. Dominant follicles in the left ovary. Right ovary is
unremarkable.

Other: No abdominopelvic ascites.

Musculoskeletal: No acute or significant osseous findings.
IMPRESSION: 1. No acute abdominopelvic findings.
2. Sigmoid colonic diverticulosis without findings of acute
diverticulitis.

## 2023-03-26 ENCOUNTER — Ambulatory Visit: Payer: BC Managed Care – PPO | Admitting: Podiatry

## 2023-03-26 ENCOUNTER — Encounter: Payer: Self-pay | Admitting: Podiatry

## 2023-03-26 DIAGNOSIS — B351 Tinea unguium: Secondary | ICD-10-CM | POA: Diagnosis not present

## 2023-03-26 MED ORDER — TERBINAFINE HCL 250 MG PO TABS: 250.0000 mg | ORAL_TABLET | Freq: Every day | ORAL | 0 refills | Status: AC

## 2023-03-27 NOTE — Progress Notes (Signed)
  Subjective:  Patient ID: Karmen Stabs, female    DOB: 02-19-1975,  MRN: [id number]  Chief Complaint  Patient presents with   Nail Problem    "The right one has gotten better but now the left one is bad."     48 y.o. female presents with the above complaint. History confirmed with patient.  She notes that is starting to feel like it is returning  Objective:  Physical Exam: warm, good capillary refill, no trophic changes or ulcerative lesions, normal DP and PT pulses, and normal sensory exam.  Right hallux with 80 % clearance of onychomycosis, left great toe onychomycosis with 20% involvement    Assessment:   1. Onychomycosis       Plan:  Patient was evaluated and treated and all questions answered.  Previously tolerated Lamisil well and showed improvement, expect likely her duration of therapy has not been long enough to completely eliminated that she has had inconsistent follow-up.  Discussed with her to take his 90-day course and then see me in 4 months to reevaluate if she needs further treatment..  New photographs taken.  Rx sent to pharmacy. Return in about 4 months (around 07/26/2023) for follow up after nail fungus treatment.

## 2023-07-25 ENCOUNTER — Ambulatory Visit (INDEPENDENT_AMBULATORY_CARE_PROVIDER_SITE_OTHER): Admitting: Podiatry

## 2023-07-25 DIAGNOSIS — Z91199 Patient's noncompliance with other medical treatment and regimen due to unspecified reason: Secondary | ICD-10-CM

## 2023-07-30 NOTE — Progress Notes (Signed)
 Patient was no-show for appointment today

## 2023-08-13 ENCOUNTER — Encounter: Admitting: Podiatry

## 2023-08-20 NOTE — Progress Notes (Signed)
 error

## 2023-11-02 ENCOUNTER — Other Ambulatory Visit: Payer: Self-pay | Admitting: Obstetrics and Gynecology

## 2023-11-02 DIAGNOSIS — Z1231 Encounter for screening mammogram for malignant neoplasm of breast: Secondary | ICD-10-CM

## 2023-11-22 LAB — COLOGUARD: COLOGUARD: NEGATIVE

## 2024-01-02 ENCOUNTER — Ambulatory Visit: Admitting: Podiatry

## 2024-01-15 ENCOUNTER — Ambulatory Visit
Admission: RE | Admit: 2024-01-15 | Discharge: 2024-01-15 | Disposition: A | Discharge status: Home / Self Care | Source: Ambulatory Visit | Attending: Obstetrics and Gynecology | Admitting: Obstetrics and Gynecology

## 2024-01-15 DIAGNOSIS — Z1231 Encounter for screening mammogram for malignant neoplasm of breast: Secondary | ICD-10-CM | POA: Insufficient documentation

## 2024-01-16 ENCOUNTER — Ambulatory Visit: Admitting: Podiatry

## 2024-01-23 ENCOUNTER — Ambulatory Visit: Admitting: Podiatry

## 2024-01-23 VITALS — Ht 62.0 in | Wt 196.0 lb

## 2024-01-23 DIAGNOSIS — B351 Tinea unguium: Secondary | ICD-10-CM

## 2024-01-23 MED ORDER — ITRACONAZOLE 100 MG PO CAPS: ORAL_CAPSULE | ORAL | 0 refills | Status: AC

## 2024-01-23 NOTE — Progress Notes (Signed)
"  °  Subjective:  Patient ID: Sydney Shelton, female    DOB: 11/29/1975,  MRN: [id number]  Chief Complaint  Patient presents with   Nail Problem    Rm 2 Patient is here for nail fungus of the left hallux. Left hallux nail is discolored with mild thickening. Pt states previously taking Lamisil  with great progress and would like to continue the mediation.    48 y.o. female presents with the above complaint. History confirmed with patient.  She returns for follow-up today, has had regrowth of the nail fungus since stopping the medication several months ago  Objective:  Physical Exam: warm, good capillary refill, no trophic changes or ulcerative lesions, normal DP and PT pulses, normal sensory exam, and onychomycosis of the bilateral hallux       Assessment:   1. Onychomycosis      Plan:  Patient was evaluated and treated and all questions answered.  Discussed further antifungal therapy, discussed alternative therapy with itraconazole pulsed dosing.  Discussed risk benefits and administration.  All questions addressed.  Rx sent to pharmacy.  Follow-up in 3 months to reevaluate.  New films taken.  Return in about 3 months (around 04/22/2024) for follow up after nail fungus treatment.   "

## 2024-01-23 NOTE — Patient Instructions (Signed)
Moisturize feet once daily; do not apply between toes: A.  Aquaphor Healing Ointment B.  Vaseline Intensive Care Lotion C.  Lubriderm Lotion D.  Gold Bond Diabetic Foot Lotion E.  Eucerin Intensive Repair Moisturizing Lotion  If you have problems reaching your feet:  A.  Aquaphor Advanced Therapy Ointment Body Spray B.  Vaseline Intensive Care Spray Lotion Advanced Repair     

## 2024-04-23 ENCOUNTER — Ambulatory Visit: Admitting: Podiatry
